# Patient Record
Sex: Female | Born: 1950
Health system: Southern US, Community
[De-identification: ages and names within clinical notes are randomized; demographics above are authoritative.]

## PROBLEM LIST (undated history)

## (undated) DIAGNOSIS — J189 Pneumonia, unspecified organism: Secondary | ICD-10-CM

---

## 2020-04-12 ENCOUNTER — Emergency Department: Payer: Medicare Other

## 2020-04-12 ENCOUNTER — Inpatient Hospital Stay (HOSPITAL_COMMUNITY)
Admission: AD | Admit: 2020-04-12 | Discharge: 2020-04-17 | DRG: 516 | Disposition: A | Discharge status: Skilled Nursing Facility | Payer: Medicare Other | Source: Other Acute Inpatient Hospital | Attending: Student | Admitting: Student

## 2020-04-12 ENCOUNTER — Inpatient Hospital Stay
Admission: EM | Admit: 2020-04-12 | Discharge: 2020-04-12 | DRG: 536 | Disposition: A | Discharge status: Other Acute Inpatient Hospital | Payer: Medicare Other | Attending: Surgery | Admitting: Surgery

## 2020-04-12 ENCOUNTER — Encounter: Payer: Self-pay | Admitting: Emergency Medicine

## 2020-04-12 DIAGNOSIS — S0990XA Unspecified injury of head, initial encounter: Secondary | ICD-10-CM | POA: Diagnosis not present

## 2020-04-12 DIAGNOSIS — S32119A Unspecified Zone I fracture of sacrum, initial encounter for closed fracture: Secondary | ICD-10-CM | POA: Diagnosis present

## 2020-04-12 DIAGNOSIS — M4856XA Collapsed vertebra, not elsewhere classified, lumbar region, initial encounter for fracture: Secondary | ICD-10-CM | POA: Diagnosis present

## 2020-04-12 DIAGNOSIS — D62 Acute posthemorrhagic anemia: Secondary | ICD-10-CM | POA: Diagnosis not present

## 2020-04-12 DIAGNOSIS — S3219XA Other fracture of sacrum, initial encounter for closed fracture: Secondary | ICD-10-CM | POA: Diagnosis not present

## 2020-04-12 DIAGNOSIS — M542 Cervicalgia: Secondary | ICD-10-CM | POA: Diagnosis not present

## 2020-04-12 DIAGNOSIS — S32810D Multiple fractures of pelvis with stable disruption of pelvic ring, subsequent encounter for fracture with routine healing: Secondary | ICD-10-CM | POA: Diagnosis not present

## 2020-04-12 DIAGNOSIS — Y92410 Unspecified street and highway as the place of occurrence of the external cause: Secondary | ICD-10-CM

## 2020-04-12 DIAGNOSIS — Z20822 Contact with and (suspected) exposure to covid-19: Secondary | ICD-10-CM | POA: Diagnosis not present

## 2020-04-12 DIAGNOSIS — R627 Adult failure to thrive: Secondary | ICD-10-CM | POA: Diagnosis not present

## 2020-04-12 DIAGNOSIS — S3289XA Fracture of other parts of pelvis, initial encounter for closed fracture: Secondary | ICD-10-CM | POA: Diagnosis not present

## 2020-04-12 DIAGNOSIS — Z4789 Encounter for other orthopedic aftercare: Secondary | ICD-10-CM | POA: Diagnosis not present

## 2020-04-12 DIAGNOSIS — S32591A Other specified fracture of right pubis, initial encounter for closed fracture: Secondary | ICD-10-CM

## 2020-04-12 DIAGNOSIS — F17210 Nicotine dependence, cigarettes, uncomplicated: Secondary | ICD-10-CM | POA: Diagnosis not present

## 2020-04-12 DIAGNOSIS — G9389 Other specified disorders of brain: Secondary | ICD-10-CM | POA: Diagnosis not present

## 2020-04-12 DIAGNOSIS — M6281 Muscle weakness (generalized): Secondary | ICD-10-CM | POA: Diagnosis not present

## 2020-04-12 DIAGNOSIS — S199XXA Unspecified injury of neck, initial encounter: Secondary | ICD-10-CM | POA: Diagnosis not present

## 2020-04-12 DIAGNOSIS — R2681 Unsteadiness on feet: Secondary | ICD-10-CM | POA: Diagnosis not present

## 2020-04-12 DIAGNOSIS — S32811A Multiple fractures of pelvis with unstable disruption of pelvic ring, initial encounter for closed fracture: Secondary | ICD-10-CM | POA: Diagnosis not present

## 2020-04-12 DIAGNOSIS — S329XXD Fracture of unspecified parts of lumbosacral spine and pelvis, subsequent encounter for fracture with routine healing: Secondary | ICD-10-CM | POA: Diagnosis not present

## 2020-04-12 DIAGNOSIS — G930 Cerebral cysts: Secondary | ICD-10-CM | POA: Diagnosis not present

## 2020-04-12 DIAGNOSIS — T148XXA Other injury of unspecified body region, initial encounter: Secondary | ICD-10-CM

## 2020-04-12 DIAGNOSIS — S32512A Fracture of superior rim of left pubis, initial encounter for closed fracture: Secondary | ICD-10-CM | POA: Diagnosis not present

## 2020-04-12 DIAGNOSIS — R2689 Other abnormalities of gait and mobility: Secondary | ICD-10-CM | POA: Diagnosis not present

## 2020-04-12 DIAGNOSIS — S329XXA Fracture of unspecified parts of lumbosacral spine and pelvis, initial encounter for closed fracture: Secondary | ICD-10-CM | POA: Diagnosis not present

## 2020-04-12 DIAGNOSIS — S32019A Unspecified fracture of first lumbar vertebra, initial encounter for closed fracture: Secondary | ICD-10-CM | POA: Diagnosis not present

## 2020-04-12 DIAGNOSIS — M2550 Pain in unspecified joint: Secondary | ICD-10-CM | POA: Diagnosis not present

## 2020-04-12 DIAGNOSIS — M549 Dorsalgia, unspecified: Secondary | ICD-10-CM | POA: Diagnosis not present

## 2020-04-12 DIAGNOSIS — Z7401 Bed confinement status: Secondary | ICD-10-CM | POA: Diagnosis not present

## 2020-04-12 DIAGNOSIS — L989 Disorder of the skin and subcutaneous tissue, unspecified: Secondary | ICD-10-CM | POA: Diagnosis not present

## 2020-04-12 DIAGNOSIS — Z4801 Encounter for change or removal of surgical wound dressing: Secondary | ICD-10-CM | POA: Diagnosis not present

## 2020-04-12 DIAGNOSIS — S3282XA Multiple fractures of pelvis without disruption of pelvic ring, initial encounter for closed fracture: Secondary | ICD-10-CM | POA: Diagnosis not present

## 2020-04-12 DIAGNOSIS — S32511A Fracture of superior rim of right pubis, initial encounter for closed fracture: Secondary | ICD-10-CM | POA: Diagnosis not present

## 2020-04-12 DIAGNOSIS — S32810A Multiple fractures of pelvis with stable disruption of pelvic ring, initial encounter for closed fracture: Secondary | ICD-10-CM | POA: Diagnosis present

## 2020-04-12 DIAGNOSIS — R0902 Hypoxemia: Secondary | ICD-10-CM | POA: Diagnosis not present

## 2020-04-12 DIAGNOSIS — Z743 Need for continuous supervision: Secondary | ICD-10-CM | POA: Diagnosis not present

## 2020-04-12 DIAGNOSIS — R262 Difficulty in walking, not elsewhere classified: Secondary | ICD-10-CM | POA: Diagnosis not present

## 2020-04-12 DIAGNOSIS — T148XXD Other injury of unspecified body region, subsequent encounter: Secondary | ICD-10-CM | POA: Diagnosis not present

## 2020-04-12 DIAGNOSIS — S32592A Other specified fracture of left pubis, initial encounter for closed fracture: Secondary | ICD-10-CM | POA: Diagnosis not present

## 2020-04-12 DIAGNOSIS — S299XXA Unspecified injury of thorax, initial encounter: Secondary | ICD-10-CM | POA: Diagnosis not present

## 2020-04-12 DIAGNOSIS — R52 Pain, unspecified: Secondary | ICD-10-CM | POA: Diagnosis not present

## 2020-04-12 HISTORY — DX: Pneumonia, unspecified organism: J18.9

## 2020-04-12 LAB — CBC
HCT: 38 % (ref 36.0–46.0)
HCT: 35 % — ABNORMAL LOW (ref 36.0–46.0)
Hemoglobin: 13.8 g/dL (ref 12.0–15.0)
Hemoglobin: 12.3 g/dL (ref 12.0–15.0)
MCH: 34.4 pg — ABNORMAL HIGH (ref 26.0–34.0)
MCH: 34 pg (ref 26.0–34.0)
MCHC: 36.3 g/dL — ABNORMAL HIGH (ref 30.0–36.0)
MCHC: 35.1 g/dL (ref 30.0–36.0)
MCV: 94.8 fL (ref 80.0–100.0)
MCV: 96.7 fL (ref 80.0–100.0)
Platelets: 215 10*3/uL (ref 150–400)
Platelets: 164 10*3/uL (ref 150–400)
RBC: 4.01 MIL/uL (ref 3.87–5.11)
RBC: 3.62 MIL/uL — ABNORMAL LOW (ref 3.87–5.11)
RDW: 11.9 % (ref 11.5–15.5)
RDW: 11.9 % (ref 11.5–15.5)
WBC: 20.7 10*3/uL — ABNORMAL HIGH (ref 4.0–10.5)
WBC: 13 10*3/uL — ABNORMAL HIGH (ref 4.0–10.5)
nRBC: 0 % (ref 0.0–0.2)
nRBC: 0 % (ref 0.0–0.2)

## 2020-04-12 LAB — BASIC METABOLIC PANEL
Anion gap: 16 — ABNORMAL HIGH (ref 5–15)
BUN: 5 mg/dL — ABNORMAL LOW (ref 8–23)
CO2: 27 mmol/L (ref 22–32)
Calcium: 9.4 mg/dL (ref 8.9–10.3)
Chloride: 100 mmol/L (ref 98–111)
Creatinine, Ser: 0.61 mg/dL (ref 0.44–1.00)
GFR calc Af Amer: >60 mL/min (ref >60)
GFR calc non Af Amer: >60 mL/min (ref >60)
Glucose, Bld: 112 mg/dL — ABNORMAL HIGH (ref 70–99)
Potassium: 2.7 mmol/L — CL (ref 3.5–5.1)
Sodium: 143 mmol/L (ref 135–145)

## 2020-04-12 LAB — TYPE AND SCREEN
ABO/RH(D): A POS
Antibody Screen: NEGATIVE

## 2020-04-12 LAB — URINALYSIS, COMPLETE (UACMP) WITH MICROSCOPIC
Bilirubin Urine: NEGATIVE
Glucose, UA: NEGATIVE mg/dL
Ketones, ur: 5 mg/dL — AB
Nitrite: NEGATIVE
Protein, ur: 100 mg/dL — AB
RBC / HPF: >50 RBC/hpf — ABNORMAL HIGH (ref 0–5)
Specific Gravity, Urine: 1.023 (ref 1.005–1.030)
WBC, UA: >50 WBC/hpf — ABNORMAL HIGH (ref 0–5)
pH: 5 (ref 5.0–8.0)

## 2020-04-12 LAB — PROTIME-INR
INR: 1.2 (ref 0.8–1.2)
Prothrombin Time: 14.9 seconds (ref 11.4–15.2)

## 2020-04-12 LAB — SARS CORONAVIRUS 2 BY RT PCR (HOSPITAL ORDER, PERFORMED IN ~~LOC~~ HOSPITAL LAB): SARS Coronavirus 2: NEGATIVE

## 2020-04-12 MED ORDER — POTASSIUM CHLORIDE 10 MEQ/100ML IV SOLN
10.0000 meq | Freq: Once | INTRAVENOUS | Status: AC
  Administered 2020-04-12: 10 meq via INTRAVENOUS
  Filled 2020-04-12: qty 100

## 2020-04-12 MED ORDER — SODIUM CHLORIDE 0.9 % IV BOLUS
500.0000 mL | Freq: Once | INTRAVENOUS | Status: AC
  Administered 2020-04-12: 500 mL via INTRAVENOUS

## 2020-04-12 MED ORDER — LACTATED RINGERS IV SOLN: INTRAVENOUS | Status: DC

## 2020-04-12 MED ORDER — ACETAMINOPHEN 325 MG PO TABS: 650.0000 mg | ORAL_TABLET | Freq: Four times a day (QID) | ORAL | Status: DC | PRN

## 2020-04-12 MED ORDER — HALOPERIDOL LACTATE 5 MG/ML IJ SOLN
2.0000 mg | Freq: Once | INTRAMUSCULAR | Status: AC
  Administered 2020-04-12: 2 mg via INTRAVENOUS
  Filled 2020-04-12: qty 1

## 2020-04-12 MED ORDER — OXYCODONE-ACETAMINOPHEN 5-325 MG PO TABS
1.0000 | ORAL_TABLET | Freq: Once | ORAL | Status: AC
  Administered 2020-04-12: 1 via ORAL
  Filled 2020-04-12: qty 1

## 2020-04-12 MED ORDER — SODIUM CHLORIDE 0.9 % IV BOLUS: 1000.0000 mL | Freq: Once | INTRAVENOUS | Status: DC

## 2020-04-12 MED ORDER — SODIUM CHLORIDE 0.9 % IV SOLN
1.0000 g | Freq: Once | INTRAVENOUS | Status: AC
  Administered 2020-04-12: 1 g via INTRAVENOUS
  Filled 2020-04-12: qty 10

## 2020-04-12 MED ORDER — IOHEXOL 300 MG/ML  SOLN
100.0000 mL | Freq: Once | INTRAMUSCULAR | Status: AC | PRN
  Administered 2020-04-12: 100 mL via INTRAVENOUS
  Filled 2020-04-12: qty 100

## 2020-04-12 MED ORDER — POTASSIUM CHLORIDE CRYS ER 20 MEQ PO TBCR
40.0000 meq | EXTENDED_RELEASE_TABLET | Freq: Once | ORAL | Status: AC
  Administered 2020-04-12: 40 meq via ORAL
  Filled 2020-04-12: qty 2

## 2020-04-12 MED ORDER — MORPHINE SULFATE (PF) 2 MG/ML IV SOLN: 2.0000 mg | INTRAVENOUS | Status: DC | PRN

## 2020-04-12 NOTE — ED Notes (Signed)
Date and time results received: 04/12/20 1247   Test: Potassium Critical Value: 2.7  Name of Provider Notified: Morrie Sheldon PA
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
## 2020-04-12 NOTE — ED Notes (Addendum)
Pt out of bed, IV has been ripped out, bleeding from site. Pt sudden onset of confusion. Pt escorted back to bed with staff. New IV placed. Bandage applied to old IV site. MD updated as well as [facility] and [place].

## 2020-04-12 NOTE — Progress Notes (Signed)
Patient admited from [facility] medical center. Pt. Is alert and oriented x 4. Pt. Oriented to the unit. Call bell is within reach. Pt.denies pain at this time. Pt is resting.

## 2020-04-12 NOTE — ED Notes (Signed)
[facility] transporting pt out of ED post CT 2nd scan. Will power share result. Moses Lyda Jester RN updated.
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
## 2020-04-12 NOTE — ED Notes (Signed)
Patient transported to CT 
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
## 2020-04-12 NOTE — ED Notes (Signed)
Given report to [facility]  
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
## 2020-04-13 ENCOUNTER — Encounter (HOSPITAL_COMMUNITY)
Admission: AD | Disposition: A | Discharge status: Skilled Nursing Facility | Payer: Self-pay | Source: Other Acute Inpatient Hospital | Attending: Student

## 2020-04-13 ENCOUNTER — Inpatient Hospital Stay (HOSPITAL_COMMUNITY): Payer: Medicare Other

## 2020-04-13 ENCOUNTER — Inpatient Hospital Stay (HOSPITAL_COMMUNITY): Payer: Medicare Other | Admitting: Certified Registered Nurse Anesthetist

## 2020-04-13 ENCOUNTER — Encounter (HOSPITAL_COMMUNITY): Payer: Self-pay

## 2020-04-13 ENCOUNTER — Inpatient Hospital Stay: Admit: 2020-04-13 | Payer: Medicare Other | Admitting: Student

## 2020-04-13 ENCOUNTER — Other Ambulatory Visit: Payer: Self-pay

## 2020-04-13 HISTORY — PX: OTHER SURGICAL HISTORY: SHX169

## 2020-04-13 HISTORY — PX: ORIF PELVIC FRACTURE WITH PERCUTANEOUS SCREWS: SHX6800

## 2020-04-13 LAB — CBC
HCT: 33.6 % — ABNORMAL LOW (ref 36.0–46.0)
Hemoglobin: 11.7 g/dL — ABNORMAL LOW (ref 12.0–15.0)
MCH: 34 pg (ref 26.0–34.0)
MCHC: 34.8 g/dL (ref 30.0–36.0)
MCV: 97.7 fL (ref 80.0–100.0)
Platelets: 158 10*3/uL (ref 150–400)
RBC: 3.44 MIL/uL — ABNORMAL LOW (ref 3.87–5.11)
RDW: 12 % (ref 11.5–15.5)
WBC: 9.4 10*3/uL (ref 4.0–10.5)
nRBC: 0 % (ref 0.0–0.2)

## 2020-04-13 LAB — TYPE AND SCREEN
ABO/RH(D): A POS
Antibody Screen: NEGATIVE

## 2020-04-13 LAB — BASIC METABOLIC PANEL
Anion gap: 10 (ref 5–15)
BUN: <5 mg/dL — ABNORMAL LOW (ref 8–23)
CO2: 28 mmol/L (ref 22–32)
Calcium: 8.9 mg/dL (ref 8.9–10.3)
Chloride: 104 mmol/L (ref 98–111)
Creatinine, Ser: 0.61 mg/dL (ref 0.44–1.00)
GFR calc Af Amer: >60 mL/min (ref >60)
GFR calc non Af Amer: >60 mL/min (ref >60)
Glucose, Bld: 129 mg/dL — ABNORMAL HIGH (ref 70–99)
Potassium: 3.7 mmol/L (ref 3.5–5.1)
Sodium: 142 mmol/L (ref 135–145)

## 2020-04-13 LAB — SURGICAL PCR SCREEN
MRSA, PCR: NEGATIVE
Staphylococcus aureus: NEGATIVE

## 2020-04-13 SURGERY — CLOSED REDUCTION, PELVIS, WITH PERCUTANEOUS FIXATION
Anesthesia: General | Laterality: Right

## 2020-04-13 MED ORDER — HYDROCODONE-ACETAMINOPHEN 5-325 MG PO TABS: 1.0000 | ORAL_TABLET | ORAL | Status: DC | PRN

## 2020-04-13 MED ORDER — DEXAMETHASONE SODIUM PHOSPHATE 10 MG/ML IJ SOLN
INTRAMUSCULAR | Status: AC
  Filled 2020-04-13: qty 2

## 2020-04-13 MED ORDER — PROPOFOL 10 MG/ML IV BOLUS
INTRAVENOUS | Status: DC | PRN
  Administered 2020-04-13: 20 mg via INTRAVENOUS
  Administered 2020-04-13: 60 mg via INTRAVENOUS

## 2020-04-13 MED ORDER — PROPOFOL 10 MG/ML IV BOLUS
INTRAVENOUS | Status: AC
  Filled 2020-04-13: qty 20

## 2020-04-13 MED ORDER — PHENYLEPHRINE HCL-NACL 10-0.9 MG/250ML-% IV SOLN
INTRAVENOUS | Status: DC | PRN
  Administered 2020-04-13: 15 ug/min via INTRAVENOUS

## 2020-04-13 MED ORDER — ONDANSETRON HCL 4 MG/2ML IJ SOLN: 4.0000 mg | Freq: Four times a day (QID) | INTRAMUSCULAR | Status: DC | PRN

## 2020-04-13 MED ORDER — POTASSIUM CHLORIDE IN NACL 20-0.9 MEQ/L-% IV SOLN
INTRAVENOUS | Status: DC
  Filled 2020-04-13 (×6): qty 1000

## 2020-04-13 MED ORDER — MORPHINE SULFATE (PF) 4 MG/ML IV SOLN: 4.0000 mg | INTRAVENOUS | Status: DC | PRN

## 2020-04-13 MED ORDER — MORPHINE SULFATE (PF) 2 MG/ML IV SOLN: 2.0000 mg | INTRAVENOUS | Status: DC | PRN

## 2020-04-13 MED ORDER — ROCURONIUM BROMIDE 10 MG/ML (PF) SYRINGE
PREFILLED_SYRINGE | INTRAVENOUS | Status: DC | PRN
  Administered 2020-04-13: 50 mg via INTRAVENOUS

## 2020-04-13 MED ORDER — METHOCARBAMOL 1000 MG/10ML IJ SOLN
500.0000 mg | Freq: Four times a day (QID) | INTRAVENOUS | Status: DC | PRN
  Filled 2020-04-13: qty 5

## 2020-04-13 MED ORDER — LACTATED RINGERS IV SOLN: INTRAVENOUS | Status: DC

## 2020-04-13 MED ORDER — ACETAMINOPHEN 500 MG PO TABS: 1000.0000 mg | ORAL_TABLET | Freq: Once | ORAL | Status: DC | PRN

## 2020-04-13 MED ORDER — FENTANYL CITRATE (PF) 100 MCG/2ML IJ SOLN
25.0000 ug | INTRAMUSCULAR | Status: DC | PRN
Start: 2020-04-13 — End: 2020-04-13

## 2020-04-13 MED ORDER — LIDOCAINE 2% (20 MG/ML) 5 ML SYRINGE
INTRAMUSCULAR | Status: AC
  Filled 2020-04-13: qty 10

## 2020-04-13 MED ORDER — PHENYLEPHRINE 40 MCG/ML (10ML) SYRINGE FOR IV PUSH (FOR BLOOD PRESSURE SUPPORT)
PREFILLED_SYRINGE | INTRAVENOUS | Status: AC
  Filled 2020-04-13: qty 20

## 2020-04-13 MED ORDER — CHLORHEXIDINE GLUCONATE 0.12 % MT SOLN: 15.0000 mL | Freq: Once | OROMUCOSAL | Status: AC

## 2020-04-13 MED ORDER — CEFAZOLIN SODIUM-DEXTROSE 2-4 GM/100ML-% IV SOLN
2.0000 g | Freq: Once | INTRAVENOUS | Status: AC
  Administered 2020-04-13: 2 g via INTRAVENOUS

## 2020-04-13 MED ORDER — METOCLOPRAMIDE HCL 5 MG/ML IJ SOLN: 5.0000 mg | Freq: Three times a day (TID) | INTRAMUSCULAR | Status: DC | PRN

## 2020-04-13 MED ORDER — METHOCARBAMOL 500 MG PO TABS: 500.0000 mg | ORAL_TABLET | Freq: Four times a day (QID) | ORAL | Status: DC | PRN

## 2020-04-13 MED ORDER — DIPHENHYDRAMINE HCL 12.5 MG/5ML PO ELIX: 12.5000 mg | ORAL_SOLUTION | ORAL | Status: DC | PRN

## 2020-04-13 MED ORDER — ONDANSETRON HCL 4 MG/2ML IJ SOLN
INTRAMUSCULAR | Status: AC
  Filled 2020-04-13: qty 4

## 2020-04-13 MED ORDER — SUGAMMADEX SODIUM 200 MG/2ML IV SOLN
INTRAVENOUS | Status: DC | PRN
  Administered 2020-04-13: 150 mg via INTRAVENOUS

## 2020-04-13 MED ORDER — MORPHINE SULFATE (PF) 2 MG/ML IV SOLN: 0.5000 mg | INTRAVENOUS | Status: DC | PRN

## 2020-04-13 MED ORDER — ROCURONIUM BROMIDE 10 MG/ML (PF) SYRINGE
PREFILLED_SYRINGE | INTRAVENOUS | Status: AC
  Filled 2020-04-13: qty 20

## 2020-04-13 MED ORDER — ONDANSETRON 4 MG PO TBDP: 4.0000 mg | ORAL_TABLET | Freq: Four times a day (QID) | ORAL | Status: DC | PRN

## 2020-04-13 MED ORDER — ACETAMINOPHEN 10 MG/ML IV SOLN
INTRAVENOUS | Status: AC
  Filled 2020-04-13: qty 100

## 2020-04-13 MED ORDER — CEFAZOLIN SODIUM-DEXTROSE 2-4 GM/100ML-% IV SOLN
INTRAVENOUS | Status: AC
  Administered 2020-04-14: 2 g via INTRAVENOUS
  Filled 2020-04-13: qty 100

## 2020-04-13 MED ORDER — PHENYLEPHRINE HCL (PRESSORS) 10 MG/ML IV SOLN
INTRAVENOUS | Status: DC | PRN
  Administered 2020-04-13 (×2): 80 ug via INTRAVENOUS

## 2020-04-13 MED ORDER — ACETAMINOPHEN 10 MG/ML IV SOLN
1000.0000 mg | Freq: Once | INTRAVENOUS | Status: DC | PRN
Start: 2020-04-13 — End: 2020-04-13

## 2020-04-13 MED ORDER — DEXAMETHASONE SODIUM PHOSPHATE 10 MG/ML IJ SOLN
INTRAMUSCULAR | Status: DC | PRN
  Administered 2020-04-13: 10 mg via INTRAVENOUS

## 2020-04-13 MED ORDER — DOCUSATE SODIUM 100 MG PO CAPS
100.0000 mg | ORAL_CAPSULE | Freq: Two times a day (BID) | ORAL | Status: DC
  Administered 2020-04-13 – 2020-04-17 (×7): 100 mg via ORAL
  Filled 2020-04-13 (×7): qty 1

## 2020-04-13 MED ORDER — OXYCODONE HCL 5 MG PO TABS
ORAL_TABLET | ORAL | Status: AC
  Filled 2020-04-13: qty 1

## 2020-04-13 MED ORDER — OXYCODONE HCL 5 MG PO TABS
5.0000 mg | ORAL_TABLET | Freq: Once | ORAL | Status: AC | PRN
  Administered 2020-04-13: 5 mg via ORAL

## 2020-04-13 MED ORDER — CEFAZOLIN SODIUM-DEXTROSE 2-4 GM/100ML-% IV SOLN
2.0000 g | Freq: Three times a day (TID) | INTRAVENOUS | Status: AC
  Administered 2020-04-13 – 2020-04-14 (×2): 2 g via INTRAVENOUS
  Filled 2020-04-13 (×3): qty 100

## 2020-04-13 MED ORDER — 0.9 % SODIUM CHLORIDE (POUR BTL) OPTIME
TOPICAL | Status: DC | PRN
  Administered 2020-04-13: 1000 mL

## 2020-04-13 MED ORDER — ONDANSETRON HCL 4 MG/2ML IJ SOLN
INTRAMUSCULAR | Status: DC | PRN
  Administered 2020-04-13: 4 mg via INTRAVENOUS

## 2020-04-13 MED ORDER — ACETAMINOPHEN 325 MG PO TABS: 325.0000 mg | ORAL_TABLET | Freq: Four times a day (QID) | ORAL | Status: DC | PRN

## 2020-04-13 MED ORDER — HYDROCODONE-ACETAMINOPHEN 7.5-325 MG PO TABS
1.0000 | ORAL_TABLET | ORAL | Status: DC | PRN
  Administered 2020-04-15 (×3): 1 via ORAL
  Filled 2020-04-13 (×3): qty 1

## 2020-04-13 MED ORDER — ONDANSETRON HCL 4 MG PO TABS: 4.0000 mg | ORAL_TABLET | Freq: Four times a day (QID) | ORAL | Status: DC | PRN

## 2020-04-13 MED ORDER — OXYCODONE HCL 5 MG/5ML PO SOLN: 5.0000 mg | Freq: Once | ORAL | Status: AC | PRN

## 2020-04-13 MED ORDER — MIDAZOLAM HCL 2 MG/2ML IJ SOLN
INTRAMUSCULAR | Status: DC | PRN
  Administered 2020-04-13: 2 mg via INTRAVENOUS

## 2020-04-13 MED ORDER — ALBUMIN HUMAN 5 % IV SOLN: INTRAVENOUS | Status: DC | PRN

## 2020-04-13 MED ORDER — ACETAMINOPHEN 160 MG/5ML PO SOLN: 1000.0000 mg | Freq: Once | ORAL | Status: DC | PRN

## 2020-04-13 MED ORDER — METOCLOPRAMIDE HCL 5 MG PO TABS: 5.0000 mg | ORAL_TABLET | Freq: Three times a day (TID) | ORAL | Status: DC | PRN

## 2020-04-13 MED ORDER — POLYETHYLENE GLYCOL 3350 17 G PO PACK: 17.0000 g | PACK | Freq: Every day | ORAL | Status: DC | PRN

## 2020-04-13 MED ORDER — LIDOCAINE 2% (20 MG/ML) 5 ML SYRINGE
INTRAMUSCULAR | Status: DC | PRN
  Administered 2020-04-13: 40 mg via INTRAVENOUS

## 2020-04-13 MED ORDER — MIDAZOLAM HCL 2 MG/2ML IJ SOLN
INTRAMUSCULAR | Status: AC
  Filled 2020-04-13: qty 2

## 2020-04-13 MED ORDER — ACETAMINOPHEN 10 MG/ML IV SOLN
INTRAVENOUS | Status: DC | PRN
  Administered 2020-04-13: 1000 mg via INTRAVENOUS

## 2020-04-13 MED ORDER — FENTANYL CITRATE (PF) 250 MCG/5ML IJ SOLN
INTRAMUSCULAR | Status: AC
  Filled 2020-04-13: qty 5

## 2020-04-13 MED ORDER — FENTANYL CITRATE (PF) 250 MCG/5ML IJ SOLN
INTRAMUSCULAR | Status: DC | PRN
  Administered 2020-04-13 (×2): 50 ug via INTRAVENOUS
  Administered 2020-04-13: 100 ug via INTRAVENOUS
  Administered 2020-04-13: 50 ug via INTRAVENOUS

## 2020-04-13 MED ORDER — ENOXAPARIN SODIUM 40 MG/0.4ML ~~LOC~~ SOLN
40.0000 mg | Freq: Every day | SUBCUTANEOUS | Status: DC
  Administered 2020-04-14 – 2020-04-17 (×4): 40 mg via SUBCUTANEOUS
  Filled 2020-04-13 (×4): qty 0.4

## 2020-04-13 MED ORDER — PANTOPRAZOLE SODIUM 40 MG IV SOLR
40.0000 mg | Freq: Every day | INTRAVENOUS | Status: DC
  Filled 2020-04-13: qty 40

## 2020-04-13 MED ORDER — PANTOPRAZOLE SODIUM 40 MG PO TBEC
40.0000 mg | DELAYED_RELEASE_TABLET | Freq: Every day | ORAL | Status: DC
  Administered 2020-04-14 – 2020-04-17 (×4): 40 mg via ORAL
  Filled 2020-04-13 (×4): qty 1

## 2020-04-13 MED ORDER — CHLORHEXIDINE GLUCONATE 0.12 % MT SOLN
OROMUCOSAL | Status: AC
  Administered 2020-04-13: 15 mL via OROMUCOSAL
  Filled 2020-04-13: qty 15

## 2020-04-13 SURGICAL SUPPLY — 49 items
BIT DRILL CANN 4.5MM (BIT) ×2 IMPLANT
BLADE CLIPPER SURG (BLADE) ×3 IMPLANT
BLADE SURG 11 STRL SS (BLADE) ×3 IMPLANT
CATH FOLEY 2WAY SLVR  5CC 12FR (CATHETERS) ×2
CATH FOLEY 2WAY SLVR 5CC 12FR (CATHETERS) ×1 IMPLANT
CHLORAPREP W/TINT 26 (MISCELLANEOUS) ×3 IMPLANT
DERMABOND ADVANCED (GAUZE/BANDAGES/DRESSINGS) ×2
DERMABOND ADVANCED .7 DNX12 (GAUZE/BANDAGES/DRESSINGS) ×1 IMPLANT
DRAPE C-ARM 42X72 X-RAY (DRAPES) ×3 IMPLANT
DRAPE C-ARMOR (DRAPES) ×3 IMPLANT
DRAPE HALF SHEET 40X57 (DRAPES) ×3 IMPLANT
DRAPE INCISE IOBAN 66X45 STRL (DRAPES) ×6 IMPLANT
DRAPE SURG 17X23 STRL (DRAPES) ×18 IMPLANT
DRAPE U-SHAPE 47X51 STRL (DRAPES) ×3 IMPLANT
DRILL BIT CANN 4.5MM (BIT) ×4
DRSG MEPILEX BORDER 4X4 (GAUZE/BANDAGES/DRESSINGS) ×3 IMPLANT
DRSG MEPILEX BORDER 4X8 (GAUZE/BANDAGES/DRESSINGS) IMPLANT
DRSG TEGADERM 4X4.75 (GAUZE/BANDAGES/DRESSINGS) ×3 IMPLANT
ELECT REM PT RETURN 9FT ADLT (ELECTROSURGICAL)
ELECTRODE REM PT RTRN 9FT ADLT (ELECTROSURGICAL) IMPLANT
GAUZE SPONGE 2X2 8PLY STRL LF (GAUZE/BANDAGES/DRESSINGS) ×1 IMPLANT
GLOVE BIO SURGEON STRL SZ 6.5 (GLOVE) ×4 IMPLANT
GLOVE BIO SURGEON STRL SZ7.5 (GLOVE) ×9 IMPLANT
GLOVE BIO SURGEONS STRL SZ 6.5 (GLOVE) ×2
GLOVE BIOGEL PI IND STRL 6.5 (GLOVE) ×1 IMPLANT
GLOVE BIOGEL PI IND STRL 7.5 (GLOVE) ×1 IMPLANT
GLOVE BIOGEL PI INDICATOR 6.5 (GLOVE) ×2
GLOVE BIOGEL PI INDICATOR 7.5 (GLOVE) ×2
GOWN STRL REUS W/ TWL LRG LVL3 (GOWN DISPOSABLE) ×3 IMPLANT
GOWN STRL REUS W/TWL LRG LVL3 (GOWN DISPOSABLE) ×6
GUIDEWIRE 2.0MM (WIRE) ×9 IMPLANT
GUIDEWIRE THREADED 2.8MM (WIRE) ×9 IMPLANT
KIT BASIN OR (CUSTOM PROCEDURE TRAY) ×3 IMPLANT
KIT TURNOVER KIT B (KITS) ×3 IMPLANT
NS IRRIG 1000ML POUR BTL (IV SOLUTION) ×3 IMPLANT
PACK TOTAL JOINT (CUSTOM PROCEDURE TRAY) ×3 IMPLANT
PACK UNIVERSAL I (CUSTOM PROCEDURE TRAY) ×3 IMPLANT
PAD ARMBOARD 7.5X6 YLW CONV (MISCELLANEOUS) ×3 IMPLANT
SCREW BONE CANN 7.3X145MM F/TH (Screw) ×3 IMPLANT
SCREW BONE CANN 7.3X70 (Screw) ×3 IMPLANT
SCREW BONE CANN 7.3X75 HEX (Screw) ×3 IMPLANT
SCREW LOCK CANN FT 7.3X135 (Screw) ×3 IMPLANT
SPONGE GAUZE 2X2 STER 10/PKG (GAUZE/BANDAGES/DRESSINGS) ×2
STAPLER VISISTAT 35W (STAPLE) ×3 IMPLANT
SUT MNCRL AB 3-0 PS2 18 (SUTURE) ×3 IMPLANT
SUT MON AB 2-0 CT1 36 (SUTURE) IMPLANT
TRAY FOLEY MTR SLVR 16FR STAT (SET/KITS/TRAYS/PACK) ×3 IMPLANT
WASHER FOR 5.0 SCREWS (Washer) ×6 IMPLANT
WATER STERILE IRR 1000ML POUR (IV SOLUTION) ×3 IMPLANT

## 2020-04-13 NOTE — Transfer of Care (Signed)
Immediate Anesthesia Transfer of Care Note  Patient: Cheryl Todd  Procedure(s) Performed: ORIF PELVIC FRACTURE WITH PERCUTANEOUS SCREWS (Right )  Patient Location: PACU  Anesthesia Type:General  Level of Consciousness: drowsy  Airway & Oxygen Therapy: Patient Spontanous Breathing and Patient connected to nasal cannula oxygen  Post-op Assessment: Report given to RN and Post -op Vital signs reviewed and stable  Post vital signs: Reviewed and stable  Last Vitals:  Vitals Value Taken Time  BP    Temp    Pulse    Resp    SpO2      Last Pain:  Vitals:   04/13/20 1120  TempSrc:   PainSc: (P) Asleep         Complications: No complications documented.

## 2020-04-13 NOTE — Plan of Care (Signed)

## 2020-04-13 NOTE — Anesthesia Postprocedure Evaluation (Signed)
Anesthesia Post Note  Patient: [name]  Procedure(s) Performed: STRESS UNDER ANESTHESIA ; PERCUTANEOUS FIXATION OF PELVIS (Right )     Patient location during evaluation: PACU Anesthesia Type: General Level of consciousness: awake and alert Pain management: pain level controlled Vital Signs Assessment: post-procedure vital signs reviewed and stable Respiratory status: spontaneous breathing, nonlabored ventilation, respiratory function stable and patient connected to nasal cannula oxygen Cardiovascular status: blood pressure returned to baseline and stable Postop Assessment: no apparent nausea or vomiting Anesthetic complications: no   No complications documented.  Last Vitals:  Vitals:   04/13/20 1455 04/13/20 1926  BP: 139/80 (!) 142/87  Pulse: 92 89  Resp: 15 16  Temp: 36.8 C 36.9 C  SpO2: 98% 98%    Last Pain:  Vitals:   04/13/20 1926  TempSrc: Oral  PainSc:                  Cheryl Todd

## 2020-04-13 NOTE — Anesthesia Preprocedure Evaluation (Signed)
Anesthesia Evaluation  Patient identified by MRN, date of birth, ID band Patient awake    Reviewed: Allergy & Precautions, NPO status , Patient's Chart, lab work & pertinent test results  History of Anesthesia Complications Negative for: history of anesthetic complications  Airway Mallampati: II  TM Distance: <3 FB Neck ROM: Full    Dental  (+) Dental Advisory Given, Teeth Intact   Pulmonary neg pulmonary ROS, neg recent URI,    breath sounds clear to auscultation       Cardiovascular negative cardio ROS   Rhythm:Regular     Neuro/Psych negative neurological ROS  negative psych ROS   GI/Hepatic negative GI ROS, Neg liver ROS,   Endo/Other  negative endocrine ROS  Renal/GU negative Renal ROS     Musculoskeletal Pelvis fracture   Abdominal   Peds  Hematology negative hematology ROS (+)   Anesthesia Other Findings Black left eye  Reproductive/Obstetrics                             Anesthesia Physical Anesthesia Plan  ASA: I  Anesthesia Plan: General   Post-op Pain Management:    Induction: Intravenous  PONV Risk Score and Plan: 3 and Ondansetron and Dexamethasone  Airway Management Planned: Oral ETT  Additional Equipment: None  Intra-op Plan:   Post-operative Plan: Extubation in OR  Informed Consent: I have reviewed the patients History and Physical, chart, labs and discussed the procedure including the risks, benefits and alternatives for the proposed anesthesia with the patient or authorized representative who has indicated his/her understanding and acceptance.     Dental advisory given  Plan Discussed with: CRNA and Surgeon  Anesthesia Plan Comments:         Anesthesia Quick Evaluation
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
## 2020-04-13 NOTE — Plan of Care (Signed)

## 2020-04-13 NOTE — Progress Notes (Signed)
Report given to Gaynelle Adu, RN in short stay.  The RN was informed that the patient could not removed a gold colored ring from the right 4th finger.  The patient is ready for surgery

## 2020-04-13 NOTE — Anesthesia Procedure Notes (Signed)
Procedure Name: Intubation Date/Time: 04/13/2020 9:47 AM Performed by: Clearnce Sorrel, CRNA Pre-anesthesia Checklist: Patient identified, Emergency Drugs available, Suction available, Patient being monitored and Timeout performed Patient Re-evaluated:Patient Re-evaluated prior to induction Oxygen Delivery Method: Circle system utilized Preoxygenation: Pre-oxygenation with 100% oxygen Induction Type: IV induction Ventilation: Mask ventilation without difficulty Laryngoscope Size: Mac and 3 Grade View: Grade I Tube type: Oral Tube size: 7.0 mm Number of attempts: 1 Airway Equipment and Method: Stylet Placement Confirmation: ETT inserted through vocal cords under direct vision,  positive ETCO2 and breath sounds checked- equal and bilateral Secured at: 22 cm Tube secured with: Tape Dental Injury: Teeth and Oropharynx as per pre-operative assessment
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
## 2020-04-13 NOTE — H&P (Signed)
Please see orthopaedic consult note from same date for full H&P.  Roby Lofts, MD [facility] [phone] (office) [email or web address]

## 2020-04-14 LAB — CBC
HCT: 29.9 % — ABNORMAL LOW (ref 36.0–46.0)
Hemoglobin: 10.2 g/dL — ABNORMAL LOW (ref 12.0–15.0)
MCH: 33 pg (ref 26.0–34.0)
MCHC: 34.1 g/dL (ref 30.0–36.0)
MCV: 96.8 fL (ref 80.0–100.0)
Platelets: 138 10*3/uL — ABNORMAL LOW (ref 150–400)
RBC: 3.09 MIL/uL — ABNORMAL LOW (ref 3.87–5.11)
RDW: 11.9 % (ref 11.5–15.5)
WBC: 8.9 10*3/uL (ref 4.0–10.5)
nRBC: 0 % (ref 0.0–0.2)

## 2020-04-14 LAB — BASIC METABOLIC PANEL
Anion gap: 9 (ref 5–15)
BUN: 5 mg/dL — ABNORMAL LOW (ref 8–23)
CO2: 24 mmol/L (ref 22–32)
Calcium: 8.7 mg/dL — ABNORMAL LOW (ref 8.9–10.3)
Chloride: 106 mmol/L (ref 98–111)
Creatinine, Ser: 0.58 mg/dL (ref 0.44–1.00)
GFR calc Af Amer: >60 mL/min (ref >60)
GFR calc non Af Amer: >60 mL/min (ref >60)
Glucose, Bld: 147 mg/dL — ABNORMAL HIGH (ref 70–99)
Potassium: 3.7 mmol/L (ref 3.5–5.1)
Sodium: 139 mmol/L (ref 135–145)

## 2020-04-14 LAB — URINE CULTURE

## 2020-04-14 LAB — VITAMIN D 25 HYDROXY (VIT D DEFICIENCY, FRACTURES): Vit D, 25-Hydroxy: 31.27 ng/mL (ref 30–100)
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
## 2020-04-14 NOTE — Progress Notes (Signed)
Subjective: 1 Day Post-Op Procedure(s) (LRB): STRESS UNDER ANESTHESIA ; PERCUTANEOUS FIXATION OF PELVIS (Right) Patient reports pain as minimal when sitting or laying down.  She is eager for discharge.  Her sister is coming down from [place] to take care of her on discharge  Objective: Vital signs in last 24 hours: Temp:  [98 F (36.7 C)-98.7 F (37.1 C)] 98.5 F (36.9 C) (08/15 0400) Pulse Rate:  [81-98] 98 (08/15 0400) Resp:  [12-16] 15 (08/15 0400) BP: (118-154)/(62-89) 154/89 (08/15 0400) SpO2:  [94 %-100 %] 97 % (08/15 0400)  Intake/Output from previous day: 08/14 0701 - 08/15 0700 In: 3243.5 [P.O.:960; I.V.:1833.5; IV Piggyback:450] Out: 725 [Urine:700; Blood:25] Intake/Output this shift: No intake/output data recorded.  Recent Labs    04/12/20 1156 04/12/20 1930 04/13/20 0424 04/14/20 0149  HGB 13.8 12.3 11.7* 10.2*   Recent Labs    04/13/20 0424 04/14/20 0149  WBC 9.4 8.9  RBC 3.44* 3.09*  HCT 33.6* 29.9*  PLT 158 138*   Recent Labs    04/13/20 0424 04/14/20 0149  NA 142 139  K 3.7 3.7  CL 104 106  CO2 28 24  BUN <5* 5*  CREATININE 0.61 0.58  GLUCOSE 129* 147*  CALCIUM 8.9 8.7*   Recent Labs    04/12/20 1930  INR 1.2    ABD soft Neurovascular intact Sensation intact distally left sided rib pain   Assessment/Plan: 1 Day Post-Op Procedure(s) (LRB): STRESS UNDER ANESTHESIA ; PERCUTANEOUS FIXATION OF PELVIS (Right) Advance diet Up with therapy  Touch down weight bearing right lower extremity    Discharge planning for tomorrow    Pascal Lux 04/14/2020, 8:16 AM
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
## 2020-04-14 NOTE — Plan of Care (Signed)

## 2020-04-15 ENCOUNTER — Encounter (HOSPITAL_COMMUNITY): Payer: Self-pay | Admitting: General Practice

## 2020-04-15 LAB — BASIC METABOLIC PANEL
Anion gap: 7 (ref 5–15)
BUN: <5 mg/dL — ABNORMAL LOW (ref 8–23)
CO2: 25 mmol/L (ref 22–32)
Calcium: 8.5 mg/dL — ABNORMAL LOW (ref 8.9–10.3)
Chloride: 108 mmol/L (ref 98–111)
Creatinine, Ser: 0.52 mg/dL (ref 0.44–1.00)
GFR calc Af Amer: >60 mL/min (ref >60)
GFR calc non Af Amer: >60 mL/min (ref >60)
Glucose, Bld: 102 mg/dL — ABNORMAL HIGH (ref 70–99)
Potassium: 3.4 mmol/L — ABNORMAL LOW (ref 3.5–5.1)
Sodium: 140 mmol/L (ref 135–145)

## 2020-04-15 LAB — CBC
HCT: 29.3 % — ABNORMAL LOW (ref 36.0–46.0)
Hemoglobin: 10 g/dL — ABNORMAL LOW (ref 12.0–15.0)
MCH: 33.9 pg (ref 26.0–34.0)
MCHC: 34.1 g/dL (ref 30.0–36.0)
MCV: 99.3 fL (ref 80.0–100.0)
Platelets: 128 10*3/uL — ABNORMAL LOW (ref 150–400)
RBC: 2.95 MIL/uL — ABNORMAL LOW (ref 3.87–5.11)
RDW: 12.2 % (ref 11.5–15.5)
WBC: 9.1 10*3/uL (ref 4.0–10.5)
nRBC: 0 % (ref 0.0–0.2)

## 2020-04-15 NOTE — Plan of Care (Signed)
  Problem: Health Behavior/Discharge Planning: Goal: Ability to manage health-related needs will improve Outcome: Progressing   Problem: Pain Managment: Goal: General experience of comfort will improve Outcome: Progressing   

## 2020-04-15 NOTE — Plan of Care (Signed)
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
## 2020-04-16 LAB — BASIC METABOLIC PANEL
Anion gap: 8 (ref 5–15)
BUN: <5 mg/dL — ABNORMAL LOW (ref 8–23)
CO2: 28 mmol/L (ref 22–32)
Calcium: 9 mg/dL (ref 8.9–10.3)
Chloride: 104 mmol/L (ref 98–111)
Creatinine, Ser: 0.5 mg/dL (ref 0.44–1.00)
GFR calc Af Amer: >60 mL/min (ref >60)
GFR calc non Af Amer: >60 mL/min (ref >60)
Glucose, Bld: 100 mg/dL — ABNORMAL HIGH (ref 70–99)
Potassium: 3.4 mmol/L — ABNORMAL LOW (ref 3.5–5.1)
Sodium: 140 mmol/L (ref 135–145)

## 2020-04-16 MED ORDER — METHOCARBAMOL 500 MG PO TABS: 500.0000 mg | ORAL_TABLET | Freq: Four times a day (QID) | ORAL | 0 refills | Status: AC | PRN

## 2020-04-16 MED ORDER — ENOXAPARIN SODIUM 40 MG/0.4ML ~~LOC~~ SOLN: 40.0000 mg | Freq: Every day | SUBCUTANEOUS | 0 refills | Status: AC

## 2020-04-16 MED ORDER — HYDROCODONE-ACETAMINOPHEN 5-325 MG PO TABS: 1.0000 | ORAL_TABLET | ORAL | 0 refills | Status: AC | PRN

## 2020-04-16 NOTE — Progress Notes (Signed)
Pt's chair alarm going off and RN walked in to pt sliding back over to bed. Pt stated "I didn't put any weight on my leg." She said she forgot to call she just wanted to go back to bed. Stressed importance for pt to call when needing assistance so pt will not fall. She verbalized understanding and agreed. Bed alarm on and in lowest position.
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
## 2020-04-16 NOTE — Plan of Care (Signed)
  Problem: Activity: Goal: Risk for activity intolerance will decrease Outcome: Progressing   Problem: Pain Managment: Goal: General experience of comfort will improve Outcome: Progressing   Problem: Safety: Goal: Ability to remain free from injury will improve Outcome: Progressing   

## 2020-04-16 NOTE — Discharge Instructions (Signed)
Orthopaedic Trauma Service Discharge Instructions   General Discharge Instructions  WEIGHT BEARING STATUS: Touchdown weightbearing right lower extremity  RANGE OF MOTION/ACTIVITY: Okay for hip, knee, ankle range of motion as tolerated  Wound Care: Incisions can be left open to air if there is no drainage. If incision continues to have drainage, follow wound care instructions below. Okay to shower if no drainage from incisions.  DVT/PE prophylaxis: Lovenox  Diet: as you were eating previously.  Can use over the counter stool softeners and bowel preparations, such as Miralax, to help with bowel movements.  Narcotics can be constipating.  Be sure to drink plenty of fluids  PAIN MEDICATION USE AND EXPECTATIONS  You have likely been given narcotic medications to help control your pain.  After a traumatic event that results in an fracture (broken bone) with or without surgery, it is ok to use narcotic pain medications to help control one's pain.  We understand that everyone responds to pain differently and each individual patient will be evaluated on a regular basis for the continued need for narcotic medications. Ideally, narcotic medication use should last no more than 6-8 weeks (coinciding with fracture healing).   As a patient it is your responsibility as well to monitor narcotic medication use and report the amount and frequency you use these medications when you come to your office visit.   We would also advise that if you are using narcotic medications, you should take a dose prior to therapy to maximize you participation.  IF YOU ARE ON NARCOTIC MEDICATIONS IT IS NOT PERMISSIBLE TO OPERATE A MOTOR VEHICLE (MOTORCYCLE/CAR/TRUCK/MOPED) OR HEAVY MACHINERY DO NOT MIX NARCOTICS WITH OTHER CNS (CENTRAL NERVOUS SYSTEM) DEPRESSANTS SUCH AS ALCOHOL   STOP SMOKING OR USING NICOTINE PRODUCTS!!!!  As discussed nicotine severely impairs your body's ability to heal surgical and traumatic wounds but  also impairs bone healing.  Wounds and bone heal by forming microscopic blood vessels (angiogenesis) and nicotine is a vasoconstrictor (essentially, shrinks blood vessels).  Therefore, if vasoconstriction occurs to these microscopic blood vessels they essentially disappear and are unable to deliver necessary nutrients to the healing tissue.  This is one modifiable factor that you can do to dramatically increase your chances of healing your injury.    (This means no smoking, no nicotine gum, patches, etc)  DO NOT USE NONSTEROIDAL ANTI-INFLAMMATORY DRUGS (NSAID'S)  Using products such as Advil (ibuprofen), Aleve (naproxen), Motrin (ibuprofen) for additional pain control during fracture healing can delay and/or prevent the healing response.  If you would like to take over the counter (OTC) medication, Tylenol (acetaminophen) is ok.  However, some narcotic medications that are given for pain control contain acetaminophen as well. Therefore, you should not exceed more than 4000 mg of tylenol in a day if you do not have liver disease.  Also note that there are may OTC medicines, such as cold medicines and allergy medicines that my contain tylenol as well.  If you have any questions about medications and/or interactions please ask your doctor/PA or your pharmacist.      ICE AND ELEVATE INJURED/OPERATIVE EXTREMITY  Using ice and elevating the injured extremity above your heart can help with swelling and pain control.  Icing in a pulsatile fashion, such as 20 minutes on and 20 minutes off, can be followed.    Do not place ice directly on skin. Make sure there is a barrier between to skin and the ice pack.    Using frozen items such as frozen peas  works well as the conform nicely to the are that needs to be iced.  USE AN ACE WRAP OR TED HOSE FOR SWELLING CONTROL  In addition to icing and elevation, Ace wraps or TED hose are used to help limit and resolve swelling.  It is recommended to use Ace wraps or TED hose  until you are informed to stop.    When using Ace Wraps start the wrapping distally (farthest away from the body) and wrap proximally (closer to the body)   Example: If you had surgery on your leg or thing and you do not have a splint on, start the ace wrap at the toes and work your way up to the thigh        If you had surgery on your upper extremity and do not have a splint on, start the ace wrap at your fingers and work your way up to the upper arm   CALL THE OFFICE WITH ANY QUESTIONS OR CONCERNS: [phone]   VISIT OUR WEBSITE FOR ADDITIONAL INFORMATION: [email or web address]    Discharge Wound Care Instructions  Do NOT apply any ointments, solutions or lotions to pin sites or surgical wounds.  These prevent needed drainage and even though solutions like hydrogen peroxide kill bacteria, they also damage cells lining the pin sites that help fight infection.  Applying lotions or ointments can keep the wounds moist and can cause them to breakdown and open up as well. This can increase the risk for infection. When in doubt call the office.  Surgical incisions should be dressed daily.  If any drainage is noted, use one layer of adaptic, then gauze, Kerlix, and an ace wrap.  Once the incision is completely dry and without drainage, it may be left open to air out.  Showering may begin 36-48 hours later.  Cleaning gently with soap and water.  Traumatic wounds should be dressed daily as well.    One layer of adaptic, gauze, Kerlix, then ace wrap.  The adaptic can be discontinued once the draining has ceased    If you have a wet to dry dressing: wet the gauze with saline the squeeze as much saline out so the gauze is moist (not soaking wet), place moistened gauze over wound, then place a dry gauze over the moist one, followed by Kerlix wrap, then ace wrap.

## 2020-04-16 NOTE — Plan of Care (Signed)
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
## 2020-04-16 NOTE — Plan of Care (Signed)

## 2020-04-16 NOTE — TOC CAGE-AID Note (Signed)
Transition of Care [facility]) - CAGE-AID Screening   Patient Details  Name: Cheryl Todd MRN: [id number] Date of Birth: 01-28-1951  Transition of Care [facility]) CM/SW Contact:    Emeterio Reeve, [place] Phone Number: 04/16/2020, 2:50 PM   Clinical Narrative:  CSW met with pt at bedside. CSW introduced self and explained her role at the hospital.  Pt denied alcohol and substance use. Pt did not need resources at this time.   CAGE-AID Screening:    Have You Ever Felt You Ought to Cut Down on Your Drinking or Drug Use?: No Have People Annoyed You By Critizing Your Drinking Or Drug Use?: No Have You Felt Bad Or Guilty About Your Drinking Or Drug Use?: No Have You Ever Had a Drink or Used Drugs First Thing In The Morning to Steady Your Nerves or to Get Rid of a Hangover?: No CAGE-AID Score: 0  Substance Abuse Education Offered: Yes     Blima Ledger, [place] Social Worker [phone]

## 2020-04-16 NOTE — TOC Progression Note (Signed)
Transition of Care [facility]) - Progression Note    Patient Details  Name: Rickiya Picariello MRN: [id number] Date of Birth: [birth year]  Transition [facility] Care [facility]) CM/SW Contact  Janae Bridgeman, RN Phone Number: 04/16/2020, 3:37 PM  Clinical Narrative:    Patient given medicare choice regarding SNf placement - patient chose [facility].  I called and spoke with Randel Pigg, CM liason and she accepted the patient for available bed and admission.  I called [facility] and started insurance authorization on the patient for the facility - ref # [id number] and patient will be given second dose of the COVID vaccine while she is at the facility.  Will continue to follow for insurance authorization and transfer when accepted.   Expected Discharge Plan: Skilled Nursing Facility Barriers to Discharge: Continued Medical Work up  Expected Discharge Plan and Services Expected Discharge Plan: Skilled Nursing Facility   Discharge Planning Services: CM Consult Post Acute Care Choice: Skilled Nursing Facility Living arrangements for the past 2 months: Single Family Home                     Social Determinants of Health (SDOH) Interventions    Readmission Risk Interventions Readmission Risk Prevention Plan 04/15/2020  Post Dischage Appt Complete  Medication Screening Complete  Transportation Screening Complete  Some recent data might be hidden

## 2020-04-17 DIAGNOSIS — T148XXD Other injury of unspecified body region, subsequent encounter: Secondary | ICD-10-CM | POA: Diagnosis not present

## 2020-04-17 DIAGNOSIS — Z4789 Encounter for other orthopedic aftercare: Secondary | ICD-10-CM | POA: Diagnosis not present

## 2020-04-17 DIAGNOSIS — R262 Difficulty in walking, not elsewhere classified: Secondary | ICD-10-CM | POA: Diagnosis not present

## 2020-04-17 DIAGNOSIS — Z743 Need for continuous supervision: Secondary | ICD-10-CM | POA: Diagnosis not present

## 2020-04-17 DIAGNOSIS — Z7401 Bed confinement status: Secondary | ICD-10-CM | POA: Diagnosis not present

## 2020-04-17 DIAGNOSIS — S3289XA Fracture of other parts of pelvis, initial encounter for closed fracture: Secondary | ICD-10-CM | POA: Diagnosis not present

## 2020-04-17 DIAGNOSIS — R2689 Other abnormalities of gait and mobility: Secondary | ICD-10-CM | POA: Diagnosis not present

## 2020-04-17 DIAGNOSIS — S329XXD Fracture of unspecified parts of lumbosacral spine and pelvis, subsequent encounter for fracture with routine healing: Secondary | ICD-10-CM | POA: Diagnosis not present

## 2020-04-17 DIAGNOSIS — R2681 Unsteadiness on feet: Secondary | ICD-10-CM | POA: Diagnosis not present

## 2020-04-17 DIAGNOSIS — M2550 Pain in unspecified joint: Secondary | ICD-10-CM | POA: Diagnosis not present

## 2020-04-17 DIAGNOSIS — Z4801 Encounter for change or removal of surgical wound dressing: Secondary | ICD-10-CM | POA: Diagnosis not present

## 2020-04-17 DIAGNOSIS — S32810D Multiple fractures of pelvis with stable disruption of pelvic ring, subsequent encounter for fracture with routine healing: Secondary | ICD-10-CM | POA: Diagnosis not present

## 2020-04-17 DIAGNOSIS — S32811D Multiple fractures of pelvis with unstable disruption of pelvic ring, subsequent encounter for fracture with routine healing: Secondary | ICD-10-CM | POA: Diagnosis not present

## 2020-04-17 DIAGNOSIS — M6281 Muscle weakness (generalized): Secondary | ICD-10-CM | POA: Diagnosis not present

## 2020-04-17 DIAGNOSIS — R627 Adult failure to thrive: Secondary | ICD-10-CM | POA: Diagnosis not present

## 2020-04-17 LAB — CULTURE, BLOOD (ROUTINE X 2)
Culture: NO GROWTH
Culture: NO GROWTH
Special Requests: ADEQUATE
Special Requests: ADEQUATE

## 2020-04-17 LAB — SARS CORONAVIRUS 2 BY RT PCR (HOSPITAL ORDER, PERFORMED IN ~~LOC~~ HOSPITAL LAB): SARS Coronavirus 2: NEGATIVE

## 2020-04-17 MED ORDER — MAGNESIUM CITRATE PO SOLN
1.0000 | Freq: Once | ORAL | Status: AC | PRN
  Administered 2020-04-17: 1 via ORAL
  Filled 2020-04-17: qty 296

## 2020-04-17 MED ORDER — BISACODYL 10 MG RE SUPP: 10.0000 mg | Freq: Every day | RECTAL | Status: DC | PRN

## 2020-04-17 NOTE — TOC Transition Note (Addendum)
Transition of Care [facility]) - CM/SW Discharge Note   Patient Details  Name: Cheryl Todd MRN: [id number] Date of Birth: Nov 09, 1950  Transition of Care [facility]) CM/SW Contact:  Janae Bridgeman, RN Phone Number: 04/17/2020, 8:58 AM   Clinical Narrative:    Case Management received authorization approval from [facility] for [facility] SNF - [id number] [phone] for 3 days to be reviewed by the SNF in 3 days.  COVID screen needed prior to transfer today by PTAR.  Randel Pigg, RNCM at SNF notified of auth information.  Will continue to follow for transfer today.  04/17/2020, 1330 - Clinicals placed in the hub for [facility].  PTAR called and scheduled for 3 pm.  Marcelino Duster, primary RN is to call report to [facility] - [phone] - Room 42 B.   Final next level of care: Skilled Nursing Facility Barriers to Discharge: Continued Medical Work up   Patient Goals and CMS Choice Patient states their goals for this hospitalization and ongoing recovery are:: Plans to discharge to SNF. CMS Medicare.gov Compare Post Acute Care list provided to:: Patient Choice offered to / list presented to : Patient  Discharge Placement                       Discharge Plan and Services   Discharge Planning Services: CM Consult Post Acute Care Choice: Skilled Nursing Facility                               Social Determinants of Health (SDOH) Interventions     Readmission Risk Interventions Readmission Risk Prevention Plan 04/15/2020  Post Dischage Appt Complete  Medication Screening Complete  Transportation Screening Complete  Some recent data might be hidden
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
## 2020-04-17 NOTE — Care Management Important Message (Signed)
Important Message  Patient Details  Name: Cheryl Todd MRN: [id number] Date of Birth: 02/08/1951   Medicare Important Message Given:  Yes     Kenedie Dirocco 04/17/2020, 4:20 PM

## 2020-04-17 NOTE — Progress Notes (Signed)
Chaplain responded to Spiritual Consult for delivery and explanation of AD.  Patient was ready for discharge to rehab facility so Chaplain delivered the AD and advised patient to continue pursuing completing it and have it notarized at the rehab facility.  Vernell Morgans Chaplain Resident

## 2020-04-17 NOTE — Plan of Care (Signed)
  Problem: Health Behavior/Discharge Planning: Goal: Ability to manage health-related needs will improve Outcome: Adequate for Discharge   Problem: Clinical Measurements: Goal: Will remain free from infection Outcome: Adequate for Discharge   Problem: Activity: Goal: Risk for activity intolerance will decrease Outcome: Adequate for Discharge
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
## 2020-04-18 DIAGNOSIS — S32810D Multiple fractures of pelvis with stable disruption of pelvic ring, subsequent encounter for fracture with routine healing: Secondary | ICD-10-CM | POA: Diagnosis not present

## 2020-04-18 DIAGNOSIS — R2689 Other abnormalities of gait and mobility: Secondary | ICD-10-CM | POA: Diagnosis not present

## 2020-04-18 DIAGNOSIS — R2681 Unsteadiness on feet: Secondary | ICD-10-CM | POA: Diagnosis not present

## 2020-04-18 DIAGNOSIS — T148XXD Other injury of unspecified body region, subsequent encounter: Secondary | ICD-10-CM | POA: Diagnosis not present

## 2020-04-23 DIAGNOSIS — R2689 Other abnormalities of gait and mobility: Secondary | ICD-10-CM | POA: Diagnosis not present

## 2020-04-23 DIAGNOSIS — Z4789 Encounter for other orthopedic aftercare: Secondary | ICD-10-CM | POA: Diagnosis not present

## 2020-04-23 DIAGNOSIS — R2681 Unsteadiness on feet: Secondary | ICD-10-CM | POA: Diagnosis not present

## 2020-04-23 DIAGNOSIS — S32810D Multiple fractures of pelvis with stable disruption of pelvic ring, subsequent encounter for fracture with routine healing: Secondary | ICD-10-CM | POA: Diagnosis not present

## 2020-04-30 DIAGNOSIS — S32811D Multiple fractures of pelvis with unstable disruption of pelvic ring, subsequent encounter for fracture with routine healing: Secondary | ICD-10-CM | POA: Diagnosis not present

## 2020-05-02 DIAGNOSIS — R2689 Other abnormalities of gait and mobility: Secondary | ICD-10-CM | POA: Diagnosis not present

## 2020-05-02 DIAGNOSIS — R2681 Unsteadiness on feet: Secondary | ICD-10-CM | POA: Diagnosis not present

## 2020-05-02 DIAGNOSIS — S32810D Multiple fractures of pelvis with stable disruption of pelvic ring, subsequent encounter for fracture with routine healing: Secondary | ICD-10-CM | POA: Diagnosis not present

## 2020-05-21 DIAGNOSIS — M6281 Muscle weakness (generalized): Secondary | ICD-10-CM | POA: Diagnosis not present

## 2020-05-28 DIAGNOSIS — S32811D Multiple fractures of pelvis with unstable disruption of pelvic ring, subsequent encounter for fracture with routine healing: Secondary | ICD-10-CM | POA: Diagnosis not present
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
